# Patient Record
Sex: Female | Born: 1940 | Race: White | Hispanic: No | Marital: Married | Smoking: Never smoker
Health system: Southern US, Community
[De-identification: ages and names within clinical notes are randomized; demographics above are authoritative.]

## PROBLEM LIST (undated history)

## (undated) DIAGNOSIS — F419 Anxiety disorder, unspecified: Secondary | ICD-10-CM

## (undated) DIAGNOSIS — IMO0001 Reserved for inherently not codable concepts without codable children: Secondary | ICD-10-CM

## (undated) DIAGNOSIS — K219 Gastro-esophageal reflux disease without esophagitis: Secondary | ICD-10-CM

## (undated) HISTORY — DX: Gastro-esophageal reflux disease without esophagitis: K21.9

## (undated) HISTORY — PX: KNEE SURGERY: SHX244

## (undated) HISTORY — DX: Reserved for inherently not codable concepts without codable children: IMO0001

## (undated) HISTORY — DX: Anxiety disorder, unspecified: F41.9

---

## 2004-08-12 ENCOUNTER — Ambulatory Visit: Payer: Self-pay | Admitting: Internal Medicine

## 2004-12-23 ENCOUNTER — Ambulatory Visit: Payer: Self-pay | Admitting: Internal Medicine

## 2004-12-29 ENCOUNTER — Ambulatory Visit: Payer: Self-pay | Admitting: Internal Medicine

## 2005-09-10 ENCOUNTER — Ambulatory Visit: Payer: Self-pay | Admitting: Internal Medicine

## 2006-03-15 ENCOUNTER — Ambulatory Visit: Payer: Self-pay | Admitting: Internal Medicine

## 2006-10-12 ENCOUNTER — Ambulatory Visit: Payer: Self-pay | Admitting: Internal Medicine

## 2007-08-15 ENCOUNTER — Ambulatory Visit: Payer: Self-pay | Admitting: Unknown Physician Specialty

## 2007-10-13 ENCOUNTER — Ambulatory Visit: Payer: Self-pay | Admitting: Internal Medicine

## 2008-10-15 ENCOUNTER — Ambulatory Visit: Payer: Self-pay | Admitting: Internal Medicine

## 2009-10-17 ENCOUNTER — Ambulatory Visit: Payer: Self-pay | Admitting: Internal Medicine

## 2010-10-20 ENCOUNTER — Ambulatory Visit: Payer: Self-pay | Admitting: Internal Medicine

## 2011-10-21 ENCOUNTER — Ambulatory Visit: Payer: Self-pay | Admitting: Internal Medicine

## 2012-10-24 ENCOUNTER — Ambulatory Visit: Payer: Self-pay | Admitting: Internal Medicine

## 2013-09-24 DIAGNOSIS — G629 Polyneuropathy, unspecified: Secondary | ICD-10-CM | POA: Insufficient documentation

## 2013-09-24 DIAGNOSIS — E7800 Pure hypercholesterolemia, unspecified: Secondary | ICD-10-CM | POA: Insufficient documentation

## 2013-09-24 DIAGNOSIS — M810 Age-related osteoporosis without current pathological fracture: Secondary | ICD-10-CM | POA: Insufficient documentation

## 2013-09-24 DIAGNOSIS — I6529 Occlusion and stenosis of unspecified carotid artery: Secondary | ICD-10-CM | POA: Insufficient documentation

## 2013-09-24 DIAGNOSIS — F419 Anxiety disorder, unspecified: Secondary | ICD-10-CM | POA: Insufficient documentation

## 2013-09-24 DIAGNOSIS — I10 Essential (primary) hypertension: Secondary | ICD-10-CM | POA: Insufficient documentation

## 2013-09-24 DIAGNOSIS — E118 Type 2 diabetes mellitus with unspecified complications: Secondary | ICD-10-CM | POA: Insufficient documentation

## 2013-09-24 DIAGNOSIS — D510 Vitamin B12 deficiency anemia due to intrinsic factor deficiency: Secondary | ICD-10-CM | POA: Insufficient documentation

## 2013-10-05 DIAGNOSIS — E119 Type 2 diabetes mellitus without complications: Secondary | ICD-10-CM | POA: Insufficient documentation

## 2013-11-01 ENCOUNTER — Ambulatory Visit: Payer: Self-pay | Admitting: Internal Medicine

## 2014-02-09 DIAGNOSIS — F324 Major depressive disorder, single episode, in partial remission: Secondary | ICD-10-CM | POA: Insufficient documentation

## 2014-05-14 ENCOUNTER — Ambulatory Visit (INDEPENDENT_AMBULATORY_CARE_PROVIDER_SITE_OTHER): Payer: Medicare Other | Admitting: Podiatry

## 2014-05-14 ENCOUNTER — Ambulatory Visit (INDEPENDENT_AMBULATORY_CARE_PROVIDER_SITE_OTHER): Payer: Medicare Other

## 2014-05-14 ENCOUNTER — Encounter: Payer: Self-pay | Admitting: Podiatry

## 2014-05-14 VITALS — BP 141/92 | HR 60 | Resp 16 | Ht 61.0 in | Wt 150.0 lb

## 2014-05-14 DIAGNOSIS — S93602A Unspecified sprain of left foot, initial encounter: Secondary | ICD-10-CM

## 2014-05-14 DIAGNOSIS — S92252A Displaced fracture of navicular [scaphoid] of left foot, initial encounter for closed fracture: Secondary | ICD-10-CM | POA: Diagnosis not present

## 2014-05-14 MED ORDER — GABAPENTIN 300 MG PO CAPS: ORAL_CAPSULE | ORAL | Status: AC

## 2014-05-14 NOTE — Progress Notes (Signed)
   Subjective:    Patient ID: Leslie SnipesKay L Gwinner, female    DOB: 08-29-1940, 74 y.o.   MRN: [id number]  HPI Comments: It started when i slipped on the ice, the left foot in the arch and goes up to the left leg. It would swell and painful. I ended up going to [facility] and saw dr wallace they did a xray and put me in a boot, i thought the boot was doing me more harm. It is now doing better   Foot Pain      Review of Systems  All other systems reviewed and are negative.      Objective:   Physical Exam: I have reviewed her past medical history medications allergies surgery social history and review of systems. Pulses are palpable bilateral. Decreased pedal sensation per Semmes-Weinstein monofilament bilateral. Deep tendon reflexes are intact bilateral and muscle strength is 5 over 5 dorsiflexion plantar flexors and inverters and everters all intrinsic musculature is intact. That she does have some tenderness on inversion against resistance of the left foot. Orthopedic evaluation demonstrates all joints distal to the ankle have full range of motion without crepitation. She does have pain on direct palpation of the posterior tibial tendon as it courses beneath the medial malleolus extending to the level of the navicular tuberosity. She has severe pain on palpation of the navicular tuberosity. Radiographic evaluation does demonstrate a comminuted nondisplaced fracture at the insertion site of the posterior tibial tendon on the navicular tuberosity.        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  Plan: I encouraged her to get back into the cam walker which she has at home for the next 4 weeks. I will follow-up with her at that time for another set of x-rays. I also wrote another prescription for gabapentin 300 mg 1 by mouth daily at bedtime.

## 2014-06-25 ENCOUNTER — Ambulatory Visit: Payer: Medicare Other | Admitting: Podiatry

## 2014-12-03 ENCOUNTER — Other Ambulatory Visit: Payer: Self-pay | Admitting: Internal Medicine

## 2014-12-03 DIAGNOSIS — Z1231 Encounter for screening mammogram for malignant neoplasm of breast: Secondary | ICD-10-CM

## 2014-12-05 ENCOUNTER — Ambulatory Visit
Admission: RE | Admit: 2014-12-05 | Discharge: 2014-12-05 | Disposition: A | Discharge status: Home / Self Care | Payer: Medicare Other | Source: Ambulatory Visit | Attending: Internal Medicine | Admitting: Internal Medicine

## 2014-12-05 ENCOUNTER — Other Ambulatory Visit: Payer: Self-pay | Admitting: Internal Medicine

## 2014-12-05 DIAGNOSIS — Z1231 Encounter for screening mammogram for malignant neoplasm of breast: Secondary | ICD-10-CM | POA: Insufficient documentation

## 2015-07-10 ENCOUNTER — Other Ambulatory Visit: Payer: Self-pay | Admitting: Physician Assistant

## 2015-07-10 DIAGNOSIS — M5416 Radiculopathy, lumbar region: Secondary | ICD-10-CM

## 2015-07-26 ENCOUNTER — Ambulatory Visit: Payer: Medicare Other

## 2015-07-31 ENCOUNTER — Ambulatory Visit: Payer: Medicare Other

## 2015-10-25 ENCOUNTER — Other Ambulatory Visit: Payer: Self-pay | Admitting: Internal Medicine

## 2015-10-25 DIAGNOSIS — Z1231 Encounter for screening mammogram for malignant neoplasm of breast: Secondary | ICD-10-CM

## 2015-11-23 ENCOUNTER — Other Ambulatory Visit: Payer: Self-pay | Admitting: Physician Assistant

## 2015-11-23 DIAGNOSIS — M5441 Lumbago with sciatica, right side: Secondary | ICD-10-CM

## 2015-12-05 ENCOUNTER — Other Ambulatory Visit: Payer: Medicare Other

## 2015-12-06 ENCOUNTER — Ambulatory Visit
Admission: RE | Admit: 2015-12-06 | Discharge: 2015-12-06 | Disposition: A | Discharge status: Home / Self Care | Payer: Medicare Other | Source: Ambulatory Visit | Attending: Physician Assistant | Admitting: Physician Assistant

## 2015-12-06 DIAGNOSIS — M5441 Lumbago with sciatica, right side: Secondary | ICD-10-CM

## 2015-12-10 ENCOUNTER — Ambulatory Visit
Admission: RE | Admit: 2015-12-10 | Discharge: 2015-12-10 | Disposition: A | Discharge status: Home / Self Care | Payer: Medicare Other | Source: Ambulatory Visit | Attending: Internal Medicine | Admitting: Internal Medicine

## 2015-12-10 ENCOUNTER — Ambulatory Visit: Payer: Medicare Other

## 2015-12-10 ENCOUNTER — Other Ambulatory Visit: Payer: Self-pay | Admitting: Internal Medicine

## 2015-12-10 DIAGNOSIS — Z1231 Encounter for screening mammogram for malignant neoplasm of breast: Secondary | ICD-10-CM | POA: Diagnosis present

## 2016-10-30 ENCOUNTER — Other Ambulatory Visit: Payer: Self-pay | Admitting: Internal Medicine

## 2016-10-30 DIAGNOSIS — Z1231 Encounter for screening mammogram for malignant neoplasm of breast: Secondary | ICD-10-CM

## 2016-12-14 ENCOUNTER — Ambulatory Visit
Admission: RE | Admit: 2016-12-14 | Discharge: 2016-12-14 | Disposition: A | Discharge status: Home / Self Care | Payer: Medicare Other | Source: Ambulatory Visit | Attending: Internal Medicine | Admitting: Internal Medicine

## 2016-12-14 DIAGNOSIS — Z1231 Encounter for screening mammogram for malignant neoplasm of breast: Secondary | ICD-10-CM | POA: Diagnosis not present

## 2017-03-15 ENCOUNTER — Other Ambulatory Visit: Payer: Self-pay | Admitting: Physical Medicine and Rehabilitation

## 2017-03-15 DIAGNOSIS — M5416 Radiculopathy, lumbar region: Secondary | ICD-10-CM

## 2017-03-30 ENCOUNTER — Ambulatory Visit
Admission: RE | Admit: 2017-03-30 | Discharge: 2017-03-30 | Disposition: A | Discharge status: Home / Self Care | Payer: Medicare Other | Source: Ambulatory Visit | Attending: Physical Medicine and Rehabilitation | Admitting: Physical Medicine and Rehabilitation

## 2017-03-30 DIAGNOSIS — M5416 Radiculopathy, lumbar region: Secondary | ICD-10-CM

## 2020-01-19 IMAGING — MR MR LUMBAR SPINE W/O CM
4 of 5 series · 23 of 48 positions shown · non-contrast
Comparison: Lumbar MRI 12/06/2015.

CLINICAL DATA: 76-year-old female with chronic lumbar back pain
after a fall in 2833. Progressive symptoms since [REDACTED]. Pain
radiating to the right hip and buttock, and right leg to the foot
with numbness. Some improvement on oral prednisone, and with spinal
injections, but recurrent symptoms.

EXAM:
MRI LUMBAR SPINE WITHOUT CONTRAST
TECHNIQUE: Multiplanar, multisequence MR imaging of the lumbar spine was
performed. No intravenous contrast was administered.

[Series 5: T1 · sagittal · 4.0mm · 0.51mm/px · 5 of 17 slices shown (1 of 2)]
[im 1/17]
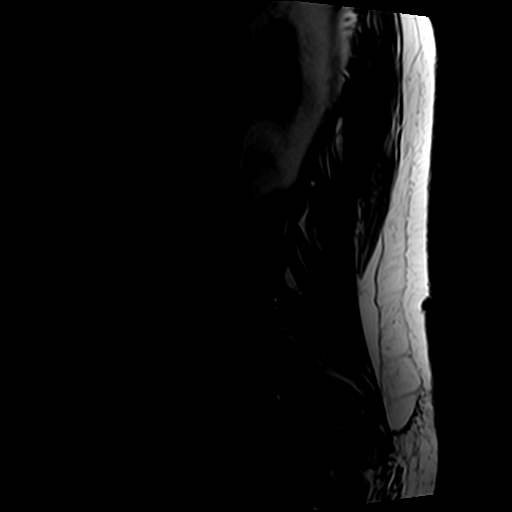
[im 4/17]
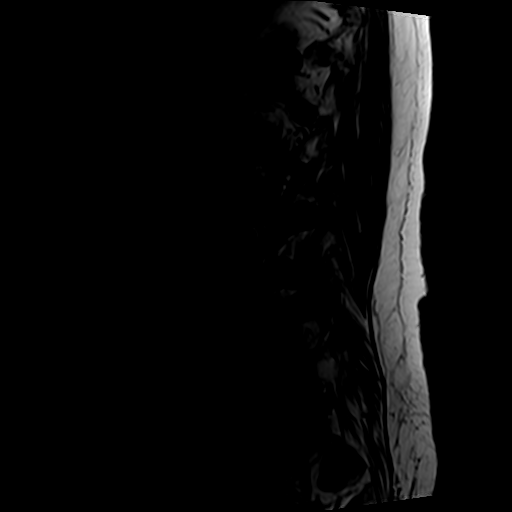
[im 7/17]
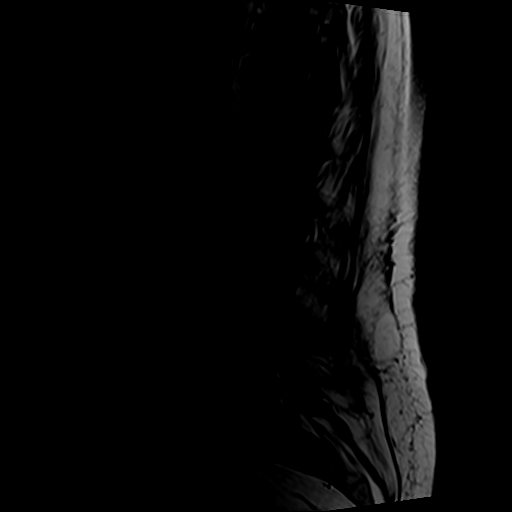
[im 10/17]
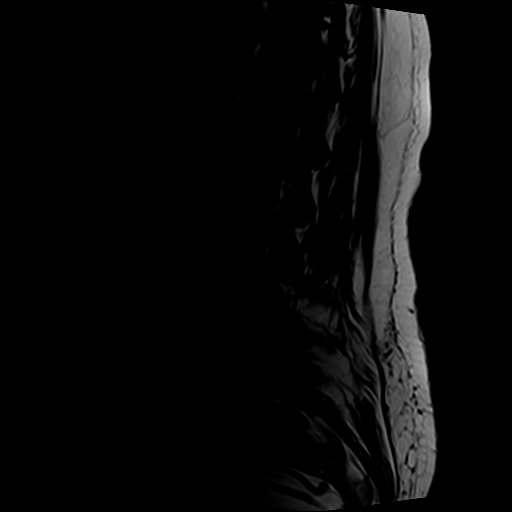
[im 17/17]
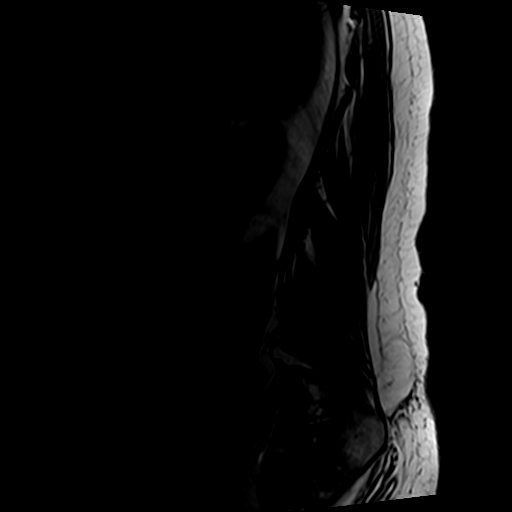

[Series 6: T2 · sagittal · 4.0mm · 0.51mm/px · 6 of 17 slices shown (1 of 2)]
[im 1/17]
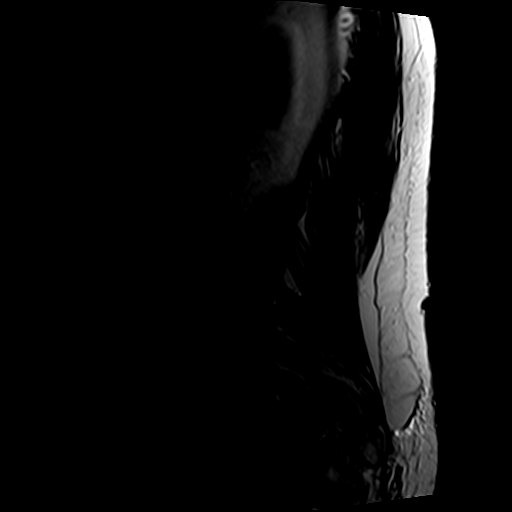
[im 4/17]
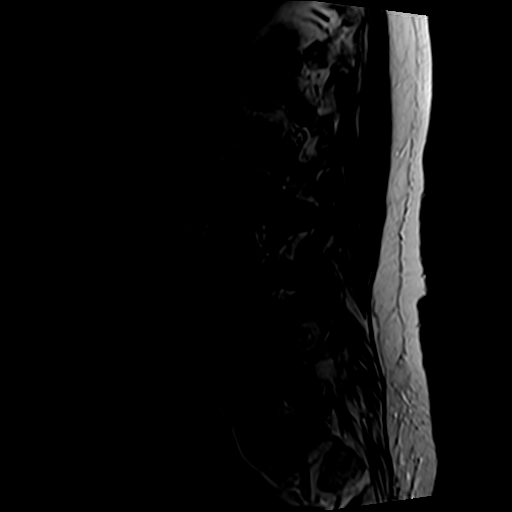
[im 7/17]
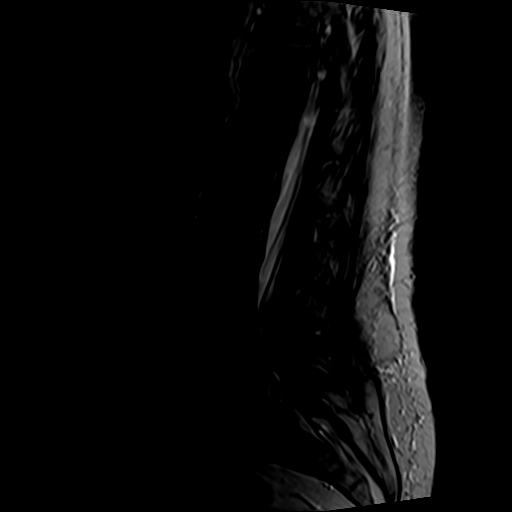
[im 10/17]
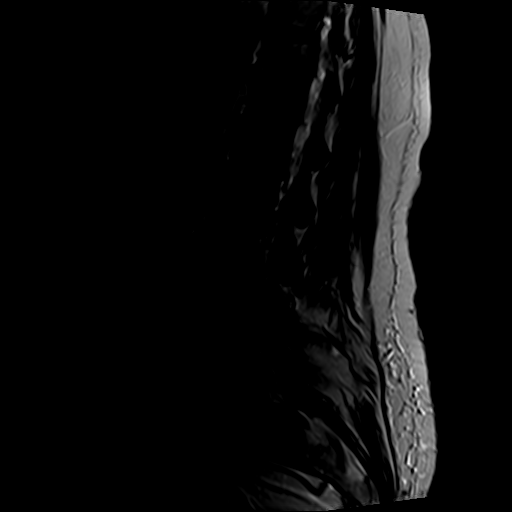
[im 13/17]
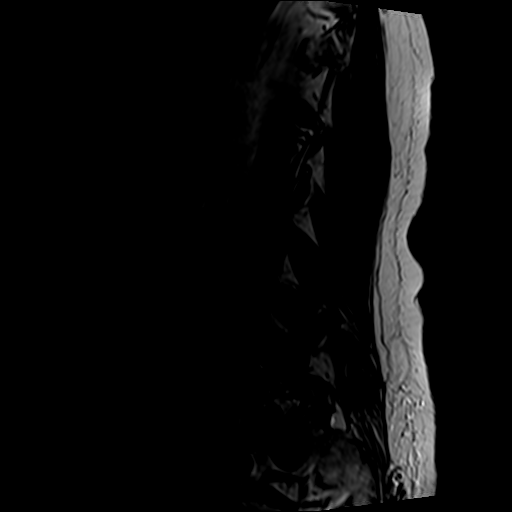
[im 17/17]
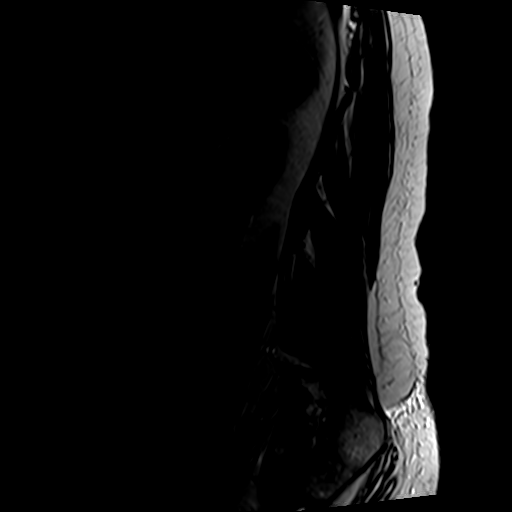

[Series 7: T2 · axial · 4.0mm · 0.70mm/px · z∈[-46,+206]mm · 9 of 46 slices shown (2 of 2)]
[im 1/46]
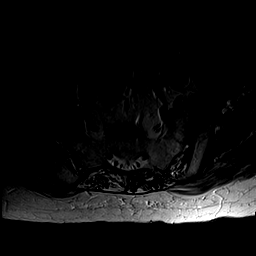
[im 7/46]
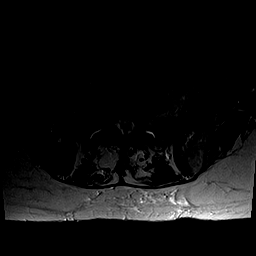
[im 13/46]
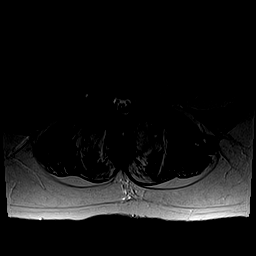
[im 20/46]
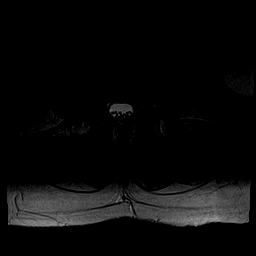
[im 23/46]
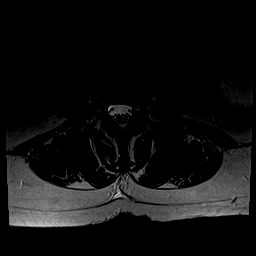
[im 26/46]
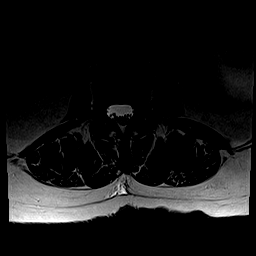
[im 33/46]
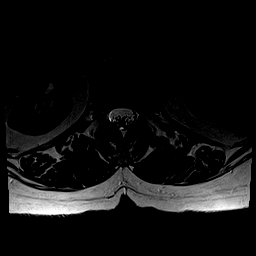
[im 39/46]
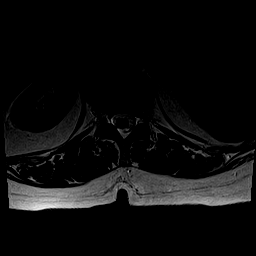
[im 46/46]
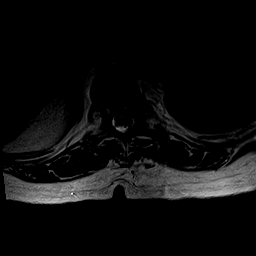

[Series 8: T1 · axial · 4.0mm · 0.35mm/px · z∈[-15,+168]mm · 3 of 46 slices shown (2 of 2)]
[im 7/46]
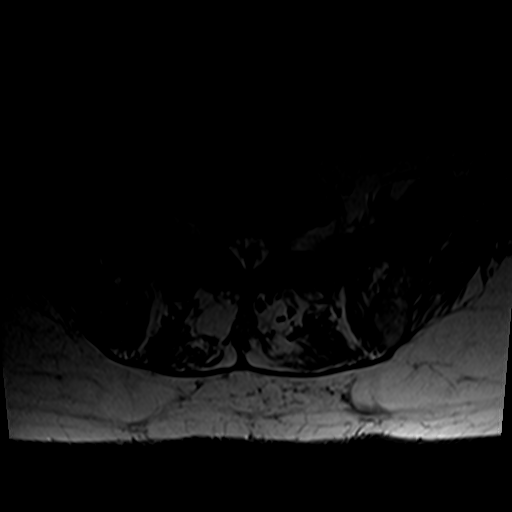
[im 23/46]
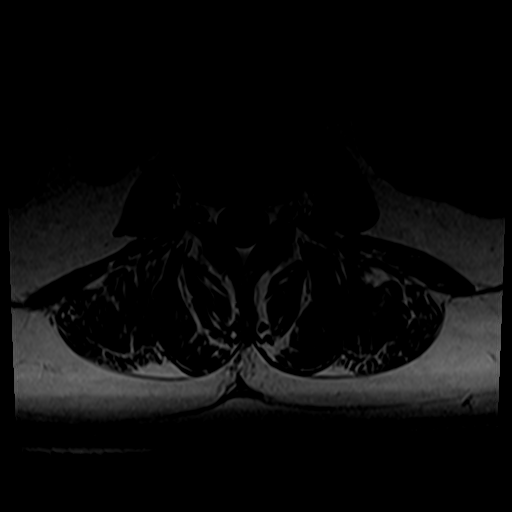
[im 39/46]
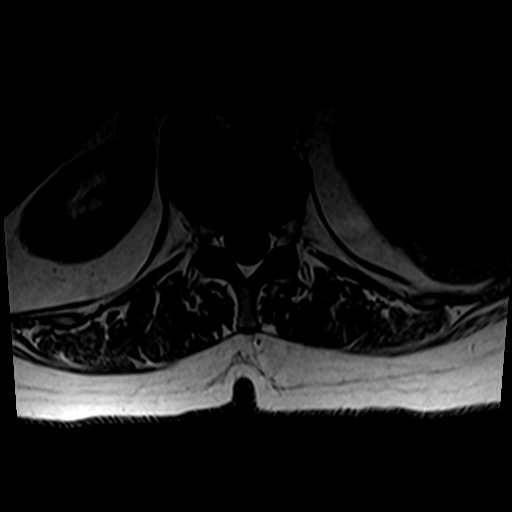

[23 of 48 positions shown; findings below may reference images not displayed]

FINDINGS: Segmentation: Lumbar segmentation appears to be normal and is the
same numbering system as on the 8793 MRI.

Alignment: Chronic grade 1 anterolisthesis at L4-L5, and L5-S1.
Subtle retrolisthesis of L3 on L4. Stable vertebral height and
alignment since 8793.

Vertebrae: Background bone marrow signal is normal. Benign vertebral
body hemangioma redemonstrated in L2. No marrow edema or evidence of
acute osseous abnormality. Intact visible sacrum and SI joints.

Conus medullaris and cauda equina: Conus extends to the L1 level.
Conus and cauda equina appear normal.

Paraspinal and other soft tissues: Stable visualized abdominal
viscera. Normal posterior paraspinal soft tissues aside from
degenerative changes detailed below.

Disc levels:

T10-T11: Mild facet hypertrophy.

T11-T12: Negative.

T12-L1: Negative; right T12 small nerve root diverticula, normal
variant.

L1-L2:  Minimal disc bulge and facet hypertrophy.  No stenosis.

L2-L3: Mild circumferential disc bulge and facet hypertrophy.
Borderline to mild left T2 foraminal stenosis.

L3-L4: Circumferential disc bulge with broad-based posterior and
left foraminal component. Moderate facet and ligament flavum
hypertrophy. Trace chronic facet joint fluid. Degenerative appearing
signal in the interspinous ligament (series 4, image 8). Mild spinal
stenosis. Borderline to mild left lateral recess stenosis (left L4
nerve level). Mild to moderate left L3 neural foraminal stenosis.
This level has mildly progressed.

L4-L5: Chronic grade 1 anterolisthesis with disc space loss and
circumferential disc bulge. Broad-based posterior and biforaminal
component of disc/pseudo disc. Chronic severe facet and moderate
ligament flavum hypertrophy. Chronic facet joint fluid. Degenerative
signal in the interspinous ligament, and associated left paracentral
interspinous synovial cyst (series 7, image 35). Severe spinal
stenosis, bilateral lateral recess stenosis (descending L5 nerve
levels), and moderate to severe bilateral L4 neural foraminal
stenosis. This level has mildly progressed.

L5-S1: Mild chronic grade 1 anterolisthesis. Circumferential disc
bulge and endplate spurring, predominantly far laterally and greater
on the left. Chronic severe facet and moderate to severe ligament
flavum hypertrophy. Chronic degenerative facet joint fluid.
Superimposed chronic epidural lipomatosis which is largely
responsible for effacement of CSF from the thecal sac at this level.
Borderline to mild lateral recess stenosis, mostly on the right, is
stable. There is a small anteriorly situated synovial cyst on the
left which projects toward but does not appear to directly involve
the left L5 neural foramen (series 6, image 13). No convincing
foraminal stenosis. This level has mildly progressed.
IMPRESSION: 1. Advanced lower lumbar spine degeneration in the setting of
multilevel mild spondylolisthesis has mildly progressed since the
8793 MRI. Associated bulky disc/pseudo-disc bulging, and moderate to
severe facet arthropathy.
2. Subsequent multifactorial severe spinal and bilateral lateral
recess stenosis at L4-L5 with moderate to severe bilateral foraminal
stenosis.
3. Mild spinal stenosis and up to moderate left neural foraminal
stenosis at L3-L4.
4. Small new left side synovial cyst at L5-S1 located near but not
directly involving the left neural foramen.
